# Patient Record
Sex: Male | Born: 1990 | Race: Black or African American | Hispanic: No | Marital: Single
Health system: Southern US, Community
[De-identification: ages and names within clinical notes are randomized; demographics above are authoritative.]

## PROBLEM LIST (undated history)

## (undated) HISTORY — PX: NO PAST SURGERIES: SHX2092

---
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
## 2014-02-25 ENCOUNTER — Emergency Department
Admission: EM | Admit: 2014-02-25 | Discharge: 2014-02-25 | Disposition: A | Discharge status: Home / Self Care | Payer: No Typology Code available for payment source | Attending: Pediatric Emergency Medicine | Admitting: Pediatric Emergency Medicine

## 2014-02-25 ENCOUNTER — Emergency Department: Payer: No Typology Code available for payment source

## 2014-02-25 DIAGNOSIS — R040 Epistaxis: Secondary | ICD-10-CM | POA: Insufficient documentation

## 2014-02-25 MED ORDER — WHITE PETROLATUM GEL
Status: DC | PRN
Start: 2014-02-25 — End: 2014-02-25

## 2014-02-25 NOTE — Discharge Instructions (Signed)
Epistaxis    You have been seen for Epistaxis (bloody nose).    Epistaxis in the medical term for a bloody nose. Epistaxis is a common condition. While it may be scary, it is not often serious. The skin in the front of the nose is very fragile. In children, it is most often damaged by direct trauma (nose-picking) or when the skin is very dry or irritated (like in dry environments on when you have a cold).    Bleeding often starts at the end of the nose. Therefore, pinching the entire nose for 15 minutes often helps stop the bleeding. When the bleeding area can be found, the doctor may use a chemical to stop the bleeding. This chemical is called Silver Nitrate. Other chemicals are sometimes used to stop the bleeding. These chemicals cause the oozing blood to clot. You may have had one or both of these treatments today. If chemical treatment doesn t stop the bleeding, packing may be placed into the nose. Packing can be uncomfortable. However, it often really works at stopping a bloody nose.    Sometimes bleeding starts from a blood vessel way in the back of the nose or in the throat. This type of bleeding is hard to control. Nasal packing by an Ear, Nose and Throat specialist is often needed.    Putting ice on the forehead or back of the neck is not effective. It WILL NOT stop the bleeding. Instead, press or squeeze the nose directly. If bleeding doesn't stop in 15 minutes, come back here or go to the nearest Emergency Department.    Do not use aspirin or non-steroidal anti-inflammatory medicines like ibuprofen (Advil or Motrin), naproxen (Naprosyn or Aleve), etc. Also stay away from alcohol. These make it hard for your blood to clot.    Avoid blowing your nose, sneezing and straining for the next few days.    YOU SHOULD SEEK MEDICAL ATTENTION IMMEDIATELY, EITHER HERE OR AT THE NEAREST EMERGENCY DEPARTMENT, IF ANY OF THE FOLLOWING OCCURS:   Bleeding doesn t stop with direct pressure.   Bleeding down the  back of the throat.   The packing gets out of place.

## 2014-02-25 NOTE — ED Provider Notes (Signed)
EMERGENCY DEPARTMENT HISTORY AND PHYSICAL EXAM     Physician/Midlevel provider first contact with patient: 02/25/14 1115         Date: 02/25/2014  Patient Name: [name]    History of Presenting Illness     Chief Complaint   Patient presents with   . Epistaxis       History Provided By: Patient    Chief Complaint: Nose bleed  Onset: 3 days ago  Timing: Two episodes  Location: Both nostrils, after starting to use the heater in his home   Quality: No pain  Severity: Mild blood (drops of blood)  Modifying Factors: Self-resolved without pressure  Associated Symptoms: Denies    Additional History: [name] is a 23 y.o. male presents to the [facility] w two self-resolved episodes of epistaxis over the last 3 days.  Pt has been using the heater at home since the change in weather and admits he has been blowing his nose frequently 2/2 a resolving cold.  Denies easy bruising, bleeding, dizziness, fainting. No bleeding into mouth/no metallic taste in mouth. Pt states  He just completely a course of abx for an URI.     PCP: Pcp, Noneorunknown, MD      Current Facility-Administered Medications   Medication Dose Route Frequency Provider Last Rate Last Dose   . White Petrolatum (VASELINE) gel   Topical PRN Quaniya Damas, Tasia Catchings, PA         No current outpatient prescriptions on file.         Past History     Past Medical History:  History reviewed. No pertinent past medical history.    Past Surgical History:  History reviewed. No pertinent past surgical history.    Family History:  No family history on file.    Social History:  History   Substance Use Topics   . Smoking status: Never Smoker    . Smokeless tobacco: Not on file   . Alcohol Use: No       Allergies:  No Known Allergies    Review of Systems     Review of Systems   Constitutional: Negative for fever, chills, weight loss and malaise/fatigue.   HENT: Positive for congestion. Negative for sore throat.    Respiratory: Positive for cough (dry). Negative for  hemoptysis, sputum production, shortness of breath and wheezing.    Cardiovascular: Negative for chest pain and palpitations.   Gastrointestinal: Negative for vomiting, abdominal pain, diarrhea, blood in stool and melena.   Genitourinary: Negative for dysuria, urgency, frequency and hematuria.   Skin: Negative for itching and rash.   Neurological: Negative for weakness and headaches.          Physical Exam     BP 118/59 mmHg  Pulse 57  Temp(Src) 97.2 F (36.2 C)  Resp 18  SpO2 100%    Physical Exam   Constitutional: He appears well-developed and well-nourished. No distress.   HENT:   Nasal turbinates + mild edema, hyperemeic. BL nares are w no active bleeding. Posterior pharynx/oropharynx is wo blood. Non-tender to palpation.    Cardiovascular: Normal rate, regular rhythm, normal heart sounds and intact distal pulses.    Pulmonary/Chest: Effort normal and breath sounds normal. No respiratory distress. He has no wheezes.   Skin: Skin is warm and dry. He is not diaphoretic. No pallor.   Nursing note and vitals reviewed.      Diagnostic Study Results     Labs -  Results     ** No results found for the last 24 hours. **          Radiologic Studies -   Radiology Results (24 Hour)     ** No results found for the last 24 hours. **          Medical Decision Making   I, Vira Browns, PA-C am the first provider for this patient.    I reviewed the vital signs, available nursing notes, past medical history, past surgical history, family history and social history, as appropriate.    Vital Signs-Reviewed the patient's vital signs.     Patient Vitals for the past 12 hrs:   BP Temp Pulse Resp   02/25/14 1103 118/59 mmHg 97.2 F (36.2 C) (!) 57 18       Pulse Oximetry Analysis - 100% on RA    Assessment & Plan:  DW Dr. Tresa Endo   23 y.o. yo male pw two, self-resolved episode of epistaxis one 3 days ago and once today. Anterior w no active bleeding on exam.   -Vaseline to BL nares    Will [place] the pt home. Educated pt re: nose  bleed causes/treatment, and when to seek oimmediate medical care. Strict return precautions provided.  The patient voiced understanding.       Diagnosis     Clinical Impression:   1. Anterior epistaxis        Francely Craw, Tasia Catchings, PA  02/25/14 1214    Chayse Zatarain, Tasia Catchings, [place]  02/25/14 1215

## 2014-02-25 NOTE — ED Notes (Signed)
Pt. Reports nose bleed to right nare x 3 days

## 2014-02-26 NOTE — Progress Notes (Signed)
[facility]; Call placed to patient's listed number, no answer, no voice mail.

## 2014-02-26 NOTE — Progress Notes (Signed)
[facility];Call placed to patient's listed number, became disconnected during conversation. Attempted to call him back right away, went to voice mail. Message left with request for call back.

## 2019-02-01 DIAGNOSIS — R079 Chest pain, unspecified: Secondary | ICD-10-CM | POA: Insufficient documentation

## 2019-02-01 DIAGNOSIS — N3000 Acute cystitis without hematuria: Secondary | ICD-10-CM | POA: Insufficient documentation

## 2019-02-01 DIAGNOSIS — R55 Syncope and collapse: Secondary | ICD-10-CM | POA: Insufficient documentation

## 2019-02-01 NOTE — ED Triage Notes (Signed)
Patient arrives to ED after having a syncopal episode at a friend's house. Patient admits to three shots of cognac but denies other illicit drug use. States this has never happened to him before. Denies other medical history and states he remembers feeling very hot right before he passed out.

## 2019-02-02 ENCOUNTER — Emergency Department
Admission: EM | Admit: 2019-02-02 | Discharge: 2019-02-02 | Disposition: A | Discharge status: Home / Self Care | Payer: Self-pay | Attending: Emergency Medicine | Admitting: Emergency Medicine

## 2019-02-02 ENCOUNTER — Emergency Department: Payer: Self-pay

## 2019-02-02 DIAGNOSIS — R55 Syncope and collapse: Secondary | ICD-10-CM

## 2019-02-02 DIAGNOSIS — N3000 Acute cystitis without hematuria: Secondary | ICD-10-CM

## 2019-02-02 LAB — URINALYSIS, COMPLETE (UACMP) WITH MICROSCOPIC
Bacteria, UA: NONE SEEN
Bilirubin Urine: NEGATIVE
Glucose, UA: NEGATIVE mg/dL
Hgb urine dipstick: NEGATIVE
Ketones, ur: NEGATIVE mg/dL
Nitrite: NEGATIVE
Protein, ur: 30 mg/dL — AB
Specific Gravity, Urine: 1.028 (ref 1.005–1.030)
WBC, UA: >50 WBC/hpf — ABNORMAL HIGH (ref 0–5)
pH: 6 (ref 5.0–8.0)

## 2019-02-02 LAB — BASIC METABOLIC PANEL
Anion gap: 11 (ref 5–15)
BUN: 20 mg/dL (ref 6–20)
CO2: 29 mmol/L (ref 22–32)
Calcium: 9.5 mg/dL (ref 8.9–10.3)
Chloride: 100 mmol/L (ref 98–111)
Creatinine, Ser: 1.43 mg/dL — ABNORMAL HIGH (ref 0.61–1.24)
GFR calc Af Amer: >60 mL/min (ref >60)
GFR calc non Af Amer: >60 mL/min (ref >60)
Glucose, Bld: 117 mg/dL — ABNORMAL HIGH (ref 70–99)
Potassium: 3.2 mmol/L — ABNORMAL LOW (ref 3.5–5.1)
Sodium: 140 mmol/L (ref 135–145)

## 2019-02-02 LAB — CBC
HCT: 48.5 % (ref 39.0–52.0)
Hemoglobin: 16.6 g/dL (ref 13.0–17.0)
MCH: 30.3 pg (ref 26.0–34.0)
MCHC: 34.2 g/dL (ref 30.0–36.0)
MCV: 88.5 fL (ref 80.0–100.0)
Platelets: 291 10*3/uL (ref 150–400)
RBC: 5.48 MIL/uL (ref 4.22–5.81)
RDW: 11.9 % (ref 11.5–15.5)
WBC: 13 10*3/uL — ABNORMAL HIGH (ref 4.0–10.5)
nRBC: 0 % (ref 0.0–0.2)

## 2019-02-02 LAB — TROPONIN I (HIGH SENSITIVITY): Troponin I (High Sensitivity): 4 ng/L (ref <18)

## 2019-02-02 MED ORDER — CEPHALEXIN 500 MG PO CAPS: 500.0000 mg | ORAL_CAPSULE | Freq: Two times a day (BID) | ORAL | 0 refills | Status: AC

## 2019-02-02 NOTE — ED Provider Notes (Signed)
[facility] Emergency Department Provider Note   ____________________________________________   First MD Initiated Contact with Patient 02/02/19 0202     (approximate)  I have reviewed the triage vital signs and the nursing notes.   HISTORY  Chief Complaint Loss of Consciousness    HPI Kirk Cowan is a 28 y.o. male with no significant past medical history who presents to the ED complaining of syncope.  Patient reports that he was drinking and hanging out with friends this evening when he began to feel hot.  He states that he stood up from a table and felt lightheaded, subsequently fell to the ground.  He thinks he was only out for a few seconds and denies any associated chest pain or shortness of breath.  He states he is otherwise been feeling well recently with no fevers, chills, cough, dysuria, hematuria, or flank pain.  He states he has never passed out like this in the past and denies any family history of sudden unexplained death.  He currently feels well and states he is back to normal.        History reviewed. No pertinent past medical history.  There are no active problems to display for this patient.   History reviewed. No pertinent surgical history.  Prior to Admission medications   Medication Sig Start Date End Date Taking? Authorizing Provider  cephALEXin (KEFLEX) 500 MG capsule Take 1 capsule (500 mg total) by mouth 2 (two) times daily for 7 days. 02/02/19 02/09/19  Chesley Noon, MD    Allergies Patient has no known allergies.  No family history on file.  Social History Social History   Tobacco Use  . Smoking status: Not on file  Substance Use Topics  . Alcohol use: Not on file  . Drug use: Not on file    Review of Systems  Constitutional: No fever/chills Eyes: No visual changes. ENT: No sore throat. Cardiovascular: Denies chest pain.  Positive for syncope. Respiratory: Denies shortness of breath. Gastrointestinal: No  abdominal pain.  No nausea, no vomiting.  No diarrhea.  No constipation. Genitourinary: Negative for dysuria. Musculoskeletal: Negative for back pain. Skin: Negative for rash. Neurological: Negative for headaches, focal weakness or numbness.  ____________________________________________   PHYSICAL EXAM:  VITAL SIGNS: ED Triage Vitals  Enc Vitals Group     BP 02/01/19 2355 (!) 148/79     Pulse Rate 02/01/19 2355 60     Resp 02/01/19 2355 18     Temp 02/01/19 2357 (!) 97.5 F (36.4 C)     Temp Source 02/01/19 2357 Axillary     SpO2 --      Weight 02/01/19 2357 160 lb (72.6 kg)     Height 02/01/19 2357 5\' 11"  (1.803 m)     Head Circumference --      Peak Flow --      Pain Score 02/01/19 2356 0     Pain Loc --      Pain Edu? --      Excl. in GC? --     Constitutional: Alert and oriented. Eyes: Conjunctivae are normal. Head: Atraumatic. Nose: No congestion/rhinnorhea. Mouth/Throat: Mucous membranes are moist. Neck: Normal ROM Cardiovascular: Normal rate, regular rhythm. Grossly normal heart sounds. Respiratory: Normal respiratory effort.  No retractions. Lungs CTAB. Gastrointestinal: Soft and nontender. No distention. Genitourinary: deferred Musculoskeletal: No lower extremity tenderness nor edema. Neurologic:  Normal speech and language. No gross focal neurologic deficits are appreciated. Skin:  Skin is warm, dry and intact. No  rash noted. Psychiatric: Mood and affect are normal. Speech and behavior are normal.  ____________________________________________   LABS (all labs ordered are listed, but only abnormal results are displayed)  Labs Reviewed  BASIC METABOLIC PANEL - Abnormal; Notable for the following components:      Result Value   Potassium 3.2 (*)    Glucose, Bld 117 (*)    Creatinine, Ser 1.43 (*)    All other components within normal limits  CBC - Abnormal; Notable for the following components:   WBC 13.0 (*)    All other components within normal  limits  URINALYSIS, COMPLETE (UACMP) WITH MICROSCOPIC - Abnormal; Notable for the following components:   Color, Urine YELLOW (*)    APPearance CLEAR (*)    Protein, ur 30 (*)    Leukocytes,Ua SMALL (*)    WBC, UA >50 (*)    All other components within normal limits  URINE CULTURE  GC/CHLAMYDIA PROBE AMP  TROPONIN I (HIGH SENSITIVITY)   ____________________________________________  EKG  ED ECG REPORT I, Blake Divine, the attending physician, personally viewed and interpreted this ECG.   Date: 02/02/2019  EKG Time: 12:01  Rate: 66  Rhythm: normal sinus rhythm  Axis: RAD  Intervals:none  ST&T Change: No ST/T change, LVH   PROCEDURES  Procedure(s) performed (including Critical Care):  Procedures   ____________________________________________   INITIAL IMPRESSION / ASSESSMENT AND PLAN / ED COURSE       28 year old male presents to the ED following syncopal episode at a friend's house after consuming alcohol.  He describes associated flushing and lightheadedness prior to passing out.  EKG without evidence of arrhythmia or ischemia, does have findings concerning for LVH.  No murmur to suggest hypertrophic cardiomyopathy and patient denies family history of sudden death.  Troponin within normal limits and remainder of labs unremarkable outside of mildly elevated creatinine.  Chest x-ray negative for acute process.  UA does show evidence of UTI, although patient denies urinary symptoms.  Will test for STI, treat apparent UTI with Keflex.  Counseled patient to establish care with PCP for echocardiogram and repeat blood work, otherwise counseled to return to the ED for new or worsening symptoms.  Patient agrees with plan.      ____________________________________________   FINAL CLINICAL IMPRESSION(S) / ED DIAGNOSES  Final diagnoses:  Syncope, unspecified syncope type  Acute cystitis without hematuria     ED Discharge Orders         Ordered    cephALEXin (KEFLEX)  500 MG capsule  2 times daily     02/02/19 0335           Note:  This document was prepared using Dragon voice recognition software and may include unintentional dictation errors.   Blake Divine, MD 02/02/19 [phone]

## 2019-02-02 NOTE — Discharge Instructions (Signed)
Please schedule an appointment with a primary care provider for follow-up from your ER visit today.  You have been given the contact information for a low-cost primary care provider [facility] [place].  Please book with him or another primary care provider about getting an ultrasound of your heart as well as recheck of your kidney function.  If you have further episodes of passing out, chest pain, shortness of breath, or other concerning symptoms, please return to the ER for further evaluation.

## 2019-02-03 LAB — URINE CULTURE: Culture: NO GROWTH

## 2019-02-05 LAB — GC/CHLAMYDIA PROBE AMP
Chlamydia trachomatis, NAA: NEGATIVE
Neisseria Gonorrhoeae by PCR: NEGATIVE

## 2019-05-25 ENCOUNTER — Ambulatory Visit (INDEPENDENT_AMBULATORY_CARE_PROVIDER_SITE_OTHER): Payer: Self-pay

## 2019-09-09 ENCOUNTER — Emergency Department: Payer: Worker's Comp, Other unspecified

## 2019-09-09 ENCOUNTER — Emergency Department
Admission: EM | Admit: 2019-09-09 | Discharge: 2019-09-09 | Disposition: A | Discharge status: Home / Self Care | Payer: Worker's Comp, Other unspecified | Attending: Emergency Medicine | Admitting: Emergency Medicine

## 2019-09-09 DIAGNOSIS — S92424A Nondisplaced fracture of distal phalanx of right great toe, initial encounter for closed fracture: Secondary | ICD-10-CM | POA: Insufficient documentation

## 2019-09-09 DIAGNOSIS — W208XXA Other cause of strike by thrown, projected or falling object, initial encounter: Secondary | ICD-10-CM | POA: Insufficient documentation

## 2019-09-09 DIAGNOSIS — Y990 Civilian activity done for income or pay: Secondary | ICD-10-CM | POA: Insufficient documentation

## 2019-09-09 NOTE — Discharge Instructions (Signed)
Dear [name]:    Thank you for choosing the [facility] Emergency Department, the premier emergency department in the [place] area.  I hope your visit today was EXCELLENT. You will receive a survey via text message that will give you the opportunity to provide feedback to your team about your visit. Please do not hesitate to reach out with any questions!    Specific instructions for your visit today:    Phalanx Fracture, Toe    You have been diagnosed with a fracture of your toe.    Your big toe has 2 bones. The other toes each have 3 bones.    A fracture is a break in a bone. It means the same thing as saying a "broken bone." In general, fractures heal over about 6-8 weeks. Over time, the broken area gets stronger than the area around it. Most toe fractures are managed with a dynamic splint. This is also called "buddy taping." A cast or even surgery may be required for fractures that do not line up well. This is especially the case for fractures of the big toe.    Fractures are often treated with medicine to lower pain and splints or casts to reduce movement. They are also treated with Resting, Icing, Compressing and Elevating the injured area. Remember this as "RICE."   REST: Limit the use of the injured body part.   ICE: By applying ice to the affected area, swelling and pain can be reduced. Place some ice cubes in a re-sealable (Ziploc) bag and add some water. Put a thin washcloth between the bag and the skin. Apply the ice bag to the area for at least 20 minutes. Do this at least 4 times per day. Using the ice for longer times and more frequently is OK. NEVER APPLY ICE DIRECTLY TO THE SKIN.   COMPRESS: Compression means to apply pressure around the injured area such as with a splint, cast or an ACE bandage. Compression decreases swelling and improves comfort. Compression should be tight enough to relieve swelling but not so tight as to decrease circulation. Increasing pain, numbness,  tingling, or change in skin color, are all signs of decreased circulation.   ELEVATE: Elevate the injured part. For example, a leg can be elevated by placing the leg on a chair while sitting and propped up on pillows while lying down.    You have been placed in a dynamic splint. This type of splinting is also called "buddy taping." The injured toe is taped to the next, uninjured one. This allows for maximum use of the foot. It also helps prevent stiffness by allowing movement. Use the guidelines below to care for your injury.   Take off the buddy tape every 2-3 days. Then, replace it with fresh tape. Put some cotton or soft gauze between the toes before taping them together.   You should use the buddy taping for at least 2-3 weeks.   You can use a hard-soled shoe for a while. This may be more comfortable. The hard shoe limits bending at the fracture site. You can buy a "post operative or "cast" shoe at a medical supply store.    YOU SHOULD SEEK MEDICAL ATTENTION IMMEDIATELY, EITHER HERE OR AT THE NEAREST EMERGENCY DEPARTMENT, IF ANY OF THE FOLLOWING OCCURS:   Severe increase in pain or swelling in the injured area.   New numbness or tingling in or below the injured area.   A cold toe that seems to have  blood supply problems develops.                 IF YOU DO NOT CONTINUE TO IMPROVE OR YOUR CONDITION WORSENS, PLEASE CONTACT YOUR DOCTOR OR RETURN IMMEDIATELY TO THE EMERGENCY DEPARTMENT.    Sincerely,  No att. providers found  Attending Emergency Physician  [facility] Emergency Department    ONSITE PHARMACY  Our full service onsite pharmacy is located in the ER waiting room.  Open 7 days a week from 9 am to 9 pm.  We accept all major insurances and prices are competitive with major retailers.  Ask your provider to print your prescriptions down to the pharmacy to speed you on your way home.    OBTAINING A PRIMARY CARE APPOINTMENT    Primary care physicians (PCPs, also known as primary care doctors)  are either internists or family medicine doctors. Both types of PCPs focus on health promotion, disease prevention, patient education and counseling, and treatment of acute and chronic medical conditions.    If you need a primary care doctor, please call the below number and ask who is receiving new patients.     [facility]  Telephone:  [phone]  [email or web address]    DOCTOR REFERRALS  Call [phone] (available 24 hours a day, 7 days a week) if you need any further referrals and we can help you find a primary care doctor or specialist.  Also, available online at:  [email or web address]    YOUR CONTACT INFORMATION  Before leaving please check with registration to make sure we have an up-to-date contact number.  You can call registration at [phone] to update your information.  For questions about your hospital bill, please call [phone].  For questions about your Emergency Dept Physician bill please call [phone].      FREE HEALTH SERVICES  If you need help with health or social services, please call 2-1-1 for a free referral to resources in your area.  2-1-1 is a free service connecting people with information on health insurance, free clinics, pregnancy, mental health, dental care, food assistance, housing, and substance abuse counseling.  Also, available online at:  http://www.[facility].org    MEDICAL RECORDS AND TESTS  Certain laboratory test results do not come back the same day, for example urine cultures.   We will contact you if other important findings are noted.  Radiology films are often reviewed again to ensure accuracy.  If there is any discrepancy, we will notify you.      Please call [phone] to pick up a complimentary CD of any radiology studies performed.  If you or your doctor would like to request a copy of your medical records, please call [phone].      ORTHOPEDIC INJURY   Please know that significant injuries can exist even when an  initial x-ray is read as normal or negative.  This can occur because some fractures (broken bones) are not initially visible on x-rays.  For this reason, close outpatient follow-up with your primary care doctor or bone specialist (orthopedist) is required.    MEDICATIONS AND FOLLOWUP  Please be aware that some prescription medications can cause drowsiness.  Use caution when driving or operating machinery.    The examination and treatment you have received in our Emergency Department is provided on an emergency basis, and is not intended to be a substitute for your primary care physician.  It is important that your doctor checks you again and that you report any new or  remaining problems at that time.      24 HOUR PHARMACIES  The nearest 24 hour pharmacy is:    [facility]  [address]  [place], [place] [address]  [phone]      ASSISTANCE WITH INSURANCE    Affordable Care Act  [facility])  Call to start or finish an application, compare plans, enroll or ask a question.  [phone]  TTY: [phone]  Web:  Healthcare.gov    Help Enrolling in [facility]  [employer] [place]  (562) 238-3219 (TOLL-FREE)  303-383-8343 (TTY)  Web:  Http://www.coverva.org    Local Help Enrolling in the [facility]  [place] [facility]  [phone] (MAIN)  Email:  health-help@nvfs .org  Web:  [email or web address]  Address:  [address] [place], [place] [address]    SEDATING MEDICATIONS  Sedating medications include strong pain medications (e.g. narcotics), muscle relaxers, benzodiazepines (used for anxiety and as muscle relaxers), Benadryl/diphenhydramine and other antihistamines for allergic reactions/itching, and other medications.  If you are unsure if you have received a sedating medication, please ask your physician or nurse.  If you received a sedating medication: DO NOT drive a car. DO NOT operate machinery. DO NOT perform jobs where you need to be alert.  DO NOT drink alcoholic beverages while  taking this medicine.     If you get dizzy, sit or lie down at the first signs. Be careful going up and down stairs.  Be extra careful to prevent falls.     Never give this medicine to others.     Keep this medicine out of reach of children.     Do not take or save old medicines. Throw them away when outdated.     Keep all medicines in a cool, dry place. DO NOT keep them in your bathroom medicine cabinet or in a cabinet above the stove.    MEDICATION REFILLS  Please be aware that we cannot refill any prescriptions through the ER. If you need further treatment from what is provided at your ER visit, please follow up with your primary care doctor or your pain management specialist.    FREESTANDING EMERGENCY DEPARTMENTS OF [facility]  Did you know [facility] has two freestanding ERs located just a few miles away?  [facility] [place] and [facility] [place] have short wait times, easy free parking directly in front of the building and top patient satisfaction scores - and the same Board Certified Emergency Medicine doctors as [facility].

## 2019-09-09 NOTE — ED Provider Notes (Signed)
[facility] EMERGENCY DEPARTMENT APP H&P         CLINICAL SUMMARY          Diagnosis:    .     Final diagnoses:   Nondisplaced fracture of distal phalanx of right great toe, initial encounter for closed fracture         MDM Notes:       29 year old male with right great toe pain for 4 days after traumatic injury while at work found to have distal tuft fracture on x-ray.  In the ED he is otherwise very well-appearing with stable vitals.  No other injuries.  Foot placed in a postop shoe with improved comfort.  Patient is okay for discharge home.  Advised outpatient follow-up with podiatry.  Return to ED for any worsening symptoms or concerns.    Discussed w/ ED attending     Disposition:         Discharge           I was present for the bedside discharge alongside the patient's ED nurse. Course of visit in the ED and discharge instructions were reviewed with patient and they were given the opportunity to ask any questions regarding their care today. Patient and/ or patient's family verbalized understanding of, and comfort with, instructions and plan.          Discharge Prescriptions     None                     CLINICAL INFORMATION        HPI:      Chief Complaint: Toe Injury  .      Context: work  Location: R great toe  Duration: 4 days  Quality: aching  Timing: persistent  Maximum Severity: moderate  Modifying Factors: worsened with movement of the extremity; improved with rest and worsened by weight bearing; improved with rest    [name] is a 29 y.o. male  has no past medical history on file. who presents with right great toe pain.  Patient was at work on Friday when a cutting board fell on his foot.  He has had pain swelling and bruising since then.  No bleeding or bruising.  No other injuries or complaints.    History obtained from: Patient    Nursing (triage) note reviewed for the following pertinent information:  Ambulatory to ER with cc R big toe pain. Cutting board fell on his foot while at  work on Friday 09/06/2019. NO meds prior to arrival. A&Ox4.      ROS:      Review of Systems   Constitutional: Negative for chills and fever.   HENT: Negative for hearing loss.    Eyes: Negative for blurred vision and double vision.   Respiratory: Negative for cough.    Cardiovascular: Negative for chest pain.   Gastrointestinal: Negative for nausea and vomiting.   Genitourinary: Negative for dysuria.   Musculoskeletal: Positive for joint pain.   Skin: Negative for rash.   Neurological: Negative for dizziness.   Endo/Heme/Allergies: Does not bruise/bleed easily.   Psychiatric/Behavioral: Negative for depression.       All other systems reviewed and negative except for what is documented above.      Physical Exam:      Pulse 60   BP 163/82(white coat syndome)   Resp 18   SpO2 97 %   Temp 98.2 F (36.8 C)    Physical Exam  Vitals  and nursing note reviewed.   Constitutional:       General: He is not in acute distress.     Appearance: He is well-developed. He is not diaphoretic.   HENT:      Head: Normocephalic and atraumatic.   Eyes:      Conjunctiva/sclera: Conjunctivae normal.      Pupils: Pupils are equal, round, and reactive to light.   Cardiovascular:      Rate and Rhythm: Normal rate and regular rhythm.      Heart sounds: Normal heart sounds.   Pulmonary:      Effort: Pulmonary effort is normal. No respiratory distress.      Breath sounds: Normal breath sounds.   Abdominal:      General: Bowel sounds are normal.      Palpations: Abdomen is soft.      Tenderness: There is no abdominal tenderness.   Musculoskeletal:      Comments: R great toe mild tenderness distal toe w/ diffuse ecchymosis and mild edema. No deformity. No wound or bleeding.   Lymphadenopathy:      Cervical: No cervical adenopathy.   Skin:     General: Skin is warm and dry.   Neurological:      Mental Status: He is alert and oriented to person, place, and time.                 PAST HISTORY        Primary Care Provider: Pcp, None, MD        PMH/PSH:     .     History reviewed. No pertinent past medical history.    He has no past surgical history on file.      Social/Family History:      He reports that he has never smoked. He has never used smokeless tobacco. He reports current drug use. Drug: Marijuana. He reports that he does not drink alcohol.    History reviewed. No pertinent family history.      Listed Medications on Arrival:    .     There are no discharge medications for this patient.     Allergies: He has No Known Allergies.            VISIT INFORMATION        Clinical Course in the ED:      Counseling: I have spoke with the patient/family and discussed today's findings, in addition to providing specific details for the plan of care. Questions are answered and there is agreement with the plan. Return precautions discussed.           Medications Given in the ED:    .     ED Medication Orders (From admission, onward)    None            Procedures:      Orthopedic Injury    Date/Time: 09/09/2019 4:01 PM  Performed by: Honor Junes, PA  Authorized by: Maceo Pro, MD   Consent: Verbal consent obtained.  Consent given by: patient  Patient identity confirmed: verbally with patient  Injury location: toe  Location details: right great toe  Injury type: fracture  Fracture type: distal phalanx  Pre-procedure neurovascular assessment: neurovascularly intact  Pre-procedure distal perfusion: normal  Pre-procedure neurological function: normal  Pre-procedure range of motion: normal    Anesthesia:  Local anesthesia used: no  Manipulation performed: no  Immobilization: cast shoe.  Post-procedure neurovascular assessment: post-procedure neurovascularly intact  Post-procedure  distal perfusion: normal  Post-procedure neurological function: normal  Post-procedure range of motion: normal  Patient tolerance: patient tolerated the procedure well with no immediate complications            Interpretations:      Differential Diagnosis (not completely inclusive):  High risk differentials considered, and include fracture, dislocation, contusion, bursitis, hematoma, tendon rupture, strain/sprain, DVT, superficial thrombophlebitis    EKG:           Rhythm Strip Interpretation / Cardiac Monitor Analysis:           Pulse Ox Analysis: saturation: 97 %; Oxygen use: room air; Interpretation: Normal      Critical Care Time:     Radiology -  interpreted by me with the following observations: R great toe distal tuft fracture    Splint - Cast shoe, placed by the tech.  After placement the extremity was neurovascularly intact.              RESULTS        Lab Results:      Results     ** No results found for the last 24 hours. **              Radiology Results:      Toes Right 2 + Views   Final Result      Very small fracture of the distal tuft of the right first distal   phalanx.      Lanier Ensign, MD    09/09/2019 11:50 AM                  Scribe Attestation:                Honor Junes, [place]  09/10/19 [phone]

## 2019-09-17 NOTE — ED Provider Notes (Signed)
Date Time: 09/17/19 12:52 AM  Patient Name: [name]  Attending Physician: Aggie Moats MD    Attending Note:   The patient was seen and examined by the mid-level (physicians assistant or nurse practitioner), or fellow, and the history of present illness, physical exam and plan of care was discussed with me. I agree with the plan as it was presented to me.  I have reviewed and agree with the final ED diagnosis.    I spoke to and examined the patient as well: Yes    [name] 29 y.o. male complaint of right great toe pain for 4 days after work injury.  Denies any other injury or pain.    Pertinent physical:   Gen: appears well, no apparent disress  Extremeties: +ttp right great toe with mild swelling.  Skin: no rash or lesions seen  Neuro: no gross motor or sensory deficits        MDM  Nondisplaced fracture of distal phalanx of right first toe.  Placed in postop shoe with outpatient podiatry follow-up.                              I was acting as a Neurosurgeon for Aggie Moats MD on [name]     I am the first attending provider for this patient and I personally performed the services documented.  is scribing for me on [name]. This note accurately reflects work and decisions made by me.  Aggie Moats MD                     Maceo Pro, MD  09/17/19 [phone]

## 2019-09-18 ENCOUNTER — Encounter (INDEPENDENT_AMBULATORY_CARE_PROVIDER_SITE_OTHER): Payer: Self-pay | Admitting: Foot & Ankle Surgery

## 2019-09-23 ENCOUNTER — Encounter (INDEPENDENT_AMBULATORY_CARE_PROVIDER_SITE_OTHER): Payer: Self-pay | Admitting: Foot & Ankle Surgery

## 2019-09-23 ENCOUNTER — Ambulatory Visit (INDEPENDENT_AMBULATORY_CARE_PROVIDER_SITE_OTHER): Payer: No Typology Code available for payment source | Admitting: Foot & Ankle Surgery

## 2019-09-23 VITALS — BP 126/73 | HR 76 | Ht 71.0 in | Wt 165.0 lb

## 2019-09-23 DIAGNOSIS — M79671 Pain in right foot: Secondary | ICD-10-CM

## 2019-09-23 DIAGNOSIS — S92421A Displaced fracture of distal phalanx of right great toe, initial encounter for closed fracture: Secondary | ICD-10-CM

## 2019-09-23 DIAGNOSIS — S90211A Contusion of right great toe with damage to nail, initial encounter: Secondary | ICD-10-CM

## 2019-09-24 NOTE — Progress Notes (Signed)
PODIATRIC SURGERY   PROGRESS NOTE      Patient Name: [name],[name]    Assessment:   -Patient is 29 y.o. male with pain and swelling to the right foot consistent with a closed, non displaced fracture of the distal phalanx as well as significant subungual hematoma which appears to be stable without any nail damage.    Plan:   Clinical and radiographic findings were discussed with the patient today.  Conservative and surgical options were reviewed.  Radiographs were taken and reviewed of the right foot  Time was taken to review and appropriately discuss the nature, etiology, and concerns with regards to injury and fracture of distal phalanx right foot.  The following was further explained/discussed utilizing visual aids, models, etc as indicated:  Radiographs confirm a nondisplaced, closed fracture of the distal phalanx right foot additionally, pain is improving and no gross deformity is noted.   As such non surgical treatment is recommended at this time.   Continue surgical shoe dispensed in the ER  Activity modifications as discussed , i.e. avoid high impact activity.  Avoid barefoot walking.   Ice/elevate/anti-inflammatories as needed.   Additionally we discussed that the hematoma appeared to be dried at this point and stable without any signs of nail damage.  The patient defers nail removal at this time and I agree with this decision.  I have cautioned him that should the nail began to lift or become uncomfortable, or should he notice any drainage including sanguinous drainage that he should call us sooner.   Recommended follow up in 4-6 weeks.  He is to avoid a closed toe work shoe for an additional 27 weeks time  Patient instructed to call with any questions, concerns, or worsening of symptoms    Time was taken to review the ER visit note, ER radiographs of the toes, radiographic report.    Subjective:   29y/o patient presents for initial evaluation and consultation with regards to injury involving the right  foot.  He relates the injury was sustained secondary to a coworker dropping a countertop on his toe.   Immediate pain and swelling were reported, however the patient was able to bear weight.  Bruising and swelling were concerning enough that the patient presented to the urgent care for evaluation and radiographs.    At that point radiographs were taken to confirm a closed, nondisplaced fracture of the distal phalanx with significant subungual hematoma .  The recommended treatment at that point was ice, elevation, buddy splinting and utilization of a surgical shoe.         Denies N/V/F/C/SOB/CP  All other systems were reviewed and are negative    Past Medical History: History reviewed. No pertinent past medical history.    The following portions of the patient's history were reviewed and updated as appropriate: allergies, current medications, past medical history, past surgical history, family history, and problem list.    Physical Exam:     Vitals:    09/23/19 1413   BP: 126/73   Pulse: 76   SpO2: 98%       Gen: AAOx3, NAD, Well developed  HEENT: Normocephalic, EOMI  Heart: Regular pulse rate and rhythm  Vascular:  Palpable pedal pulses b/Romero, CFT to all toes <3 sec. No edema. No varicosities  Derm:  Skin intact without wounds or rashes.  No erythema or ecchymosis.  MSK:  MMS 5/5 and symmetric, No other areas of pain or tenderness. No gross deformity. ROM normal without crepitus.  Neuro:  SILT. Neg tinel's sign.  Able to wiggle toes. No motor deficits.     Foot Exam:  Left: pinprick perception testing of dorsum of feet intact, dorsalis pedis and posterior tibial pulses intact, normal hair growth and normal pigmentation   Right: dorsalis pedis and posterior tibial pulses intact, capillary refill of toes intact, normal hair growth and normal pigmentation     Clinical examination of the right foot reveals bruising and swelling to the right great toe.   No gross deformity is noted  Focal TTP is noted at the right great  toe  Subungual hematoma which is dry and stable is noted underneath the nail encompassing 100% of this area.  Some proximal hematomas noted just at the edge of the nail itself.  Again this is stable, nontender  Compartments are supple and compressible, no calf pain is noted.     Standing and gait exam: Gait is nonantalgic.  Normal gait and in open toed shoe.    Imaging studies:   2 views of the right great toe were visualized today by me.  These were taken at the time of his initial injury.  Significant for small displaced extra-articular fracture of the distal phalanx*No new x-rays were taken, these images were reviewed from the ER      Signed by: Abundio Miu, DPM          The review of the patient's medications does not in any way constitute an endorsement, by this clinician,  of their use, dosage, indications, route, efficacy, interactions, or other clinical parameters.     This note was generated within the [employer] EMR using Dragon medical speech recognition software and may contain inherent errors or omissions not intended by the user. Grammatical and punctuation errors, random word insertions, deletions, pronoun errors and incomplete sentences are occasional consequences of this technology due to software limitations. Not all errors are caught or corrected.  Although every attempt is made to root out erroneus and incomplete transcription, the note may still not fully represent the intent or opinion of the author. If there are questions or concerns about the content of this note or information contained within the body of this dictation they should be addressed directly with the author for clarification.

## 2020-08-21 IMAGING — DX DG CHEST 1V PORT
1 series · 1 of 1 positions shown · non-contrast
Comparison: None.

CLINICAL DATA: CP.

EXAM:
PORTABLE CHEST 1 VIEW

[chest ap]
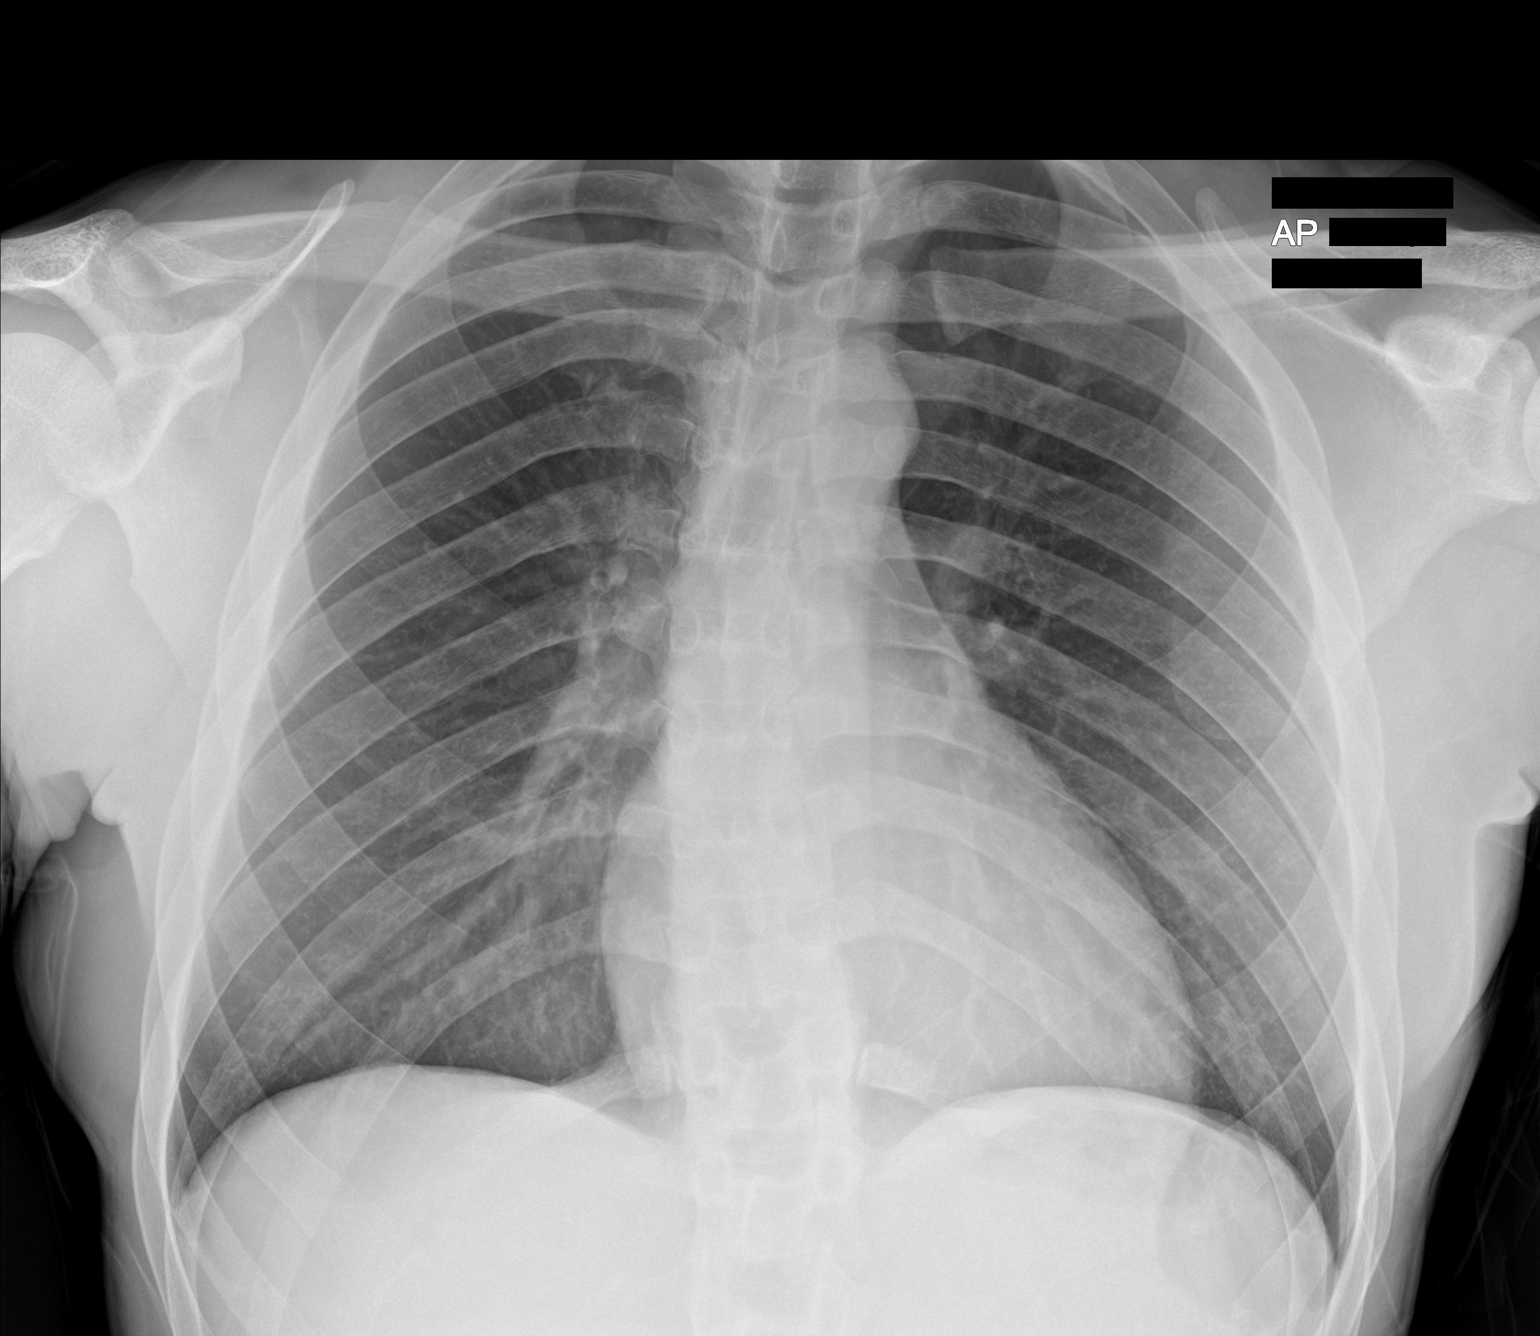

[1 of 1 positions shown; findings below may reference images not displayed]

FINDINGS: The cardiomediastinal contours are normal. The lungs are clear.
Pulmonary vasculature is normal. No consolidation, pleural effusion,
or pneumothorax. No acute osseous abnormalities are seen.
IMPRESSION: Normal chest radiograph.
